# Patient Record
Sex: Male | Born: 2000 | Hispanic: Yes | Marital: Single | Smoking: Former smoker
Health system: Southern US, Community
[De-identification: ages and names within clinical notes are randomized; demographics above are authoritative.]

## PROBLEM LIST (undated history)

## (undated) DIAGNOSIS — I639 Cerebral infarction, unspecified: Secondary | ICD-10-CM

## (undated) DIAGNOSIS — F1910 Other psychoactive substance abuse, uncomplicated: Secondary | ICD-10-CM

## (undated) DIAGNOSIS — T50901A Poisoning by unspecified drugs, medicaments and biological substances, accidental (unintentional), initial encounter: Secondary | ICD-10-CM

---
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
## 2022-03-18 IMAGING — DX DG RIBS W/ CHEST 3+V*L*
3 series · 3 of 3 positions shown · non-contrast
Comparison: None.

CLINICAL DATA: Left lower rib and chest pain.

EXAM:
LEFT RIBS AND CHEST - 3+ VIEW

[chest ap]
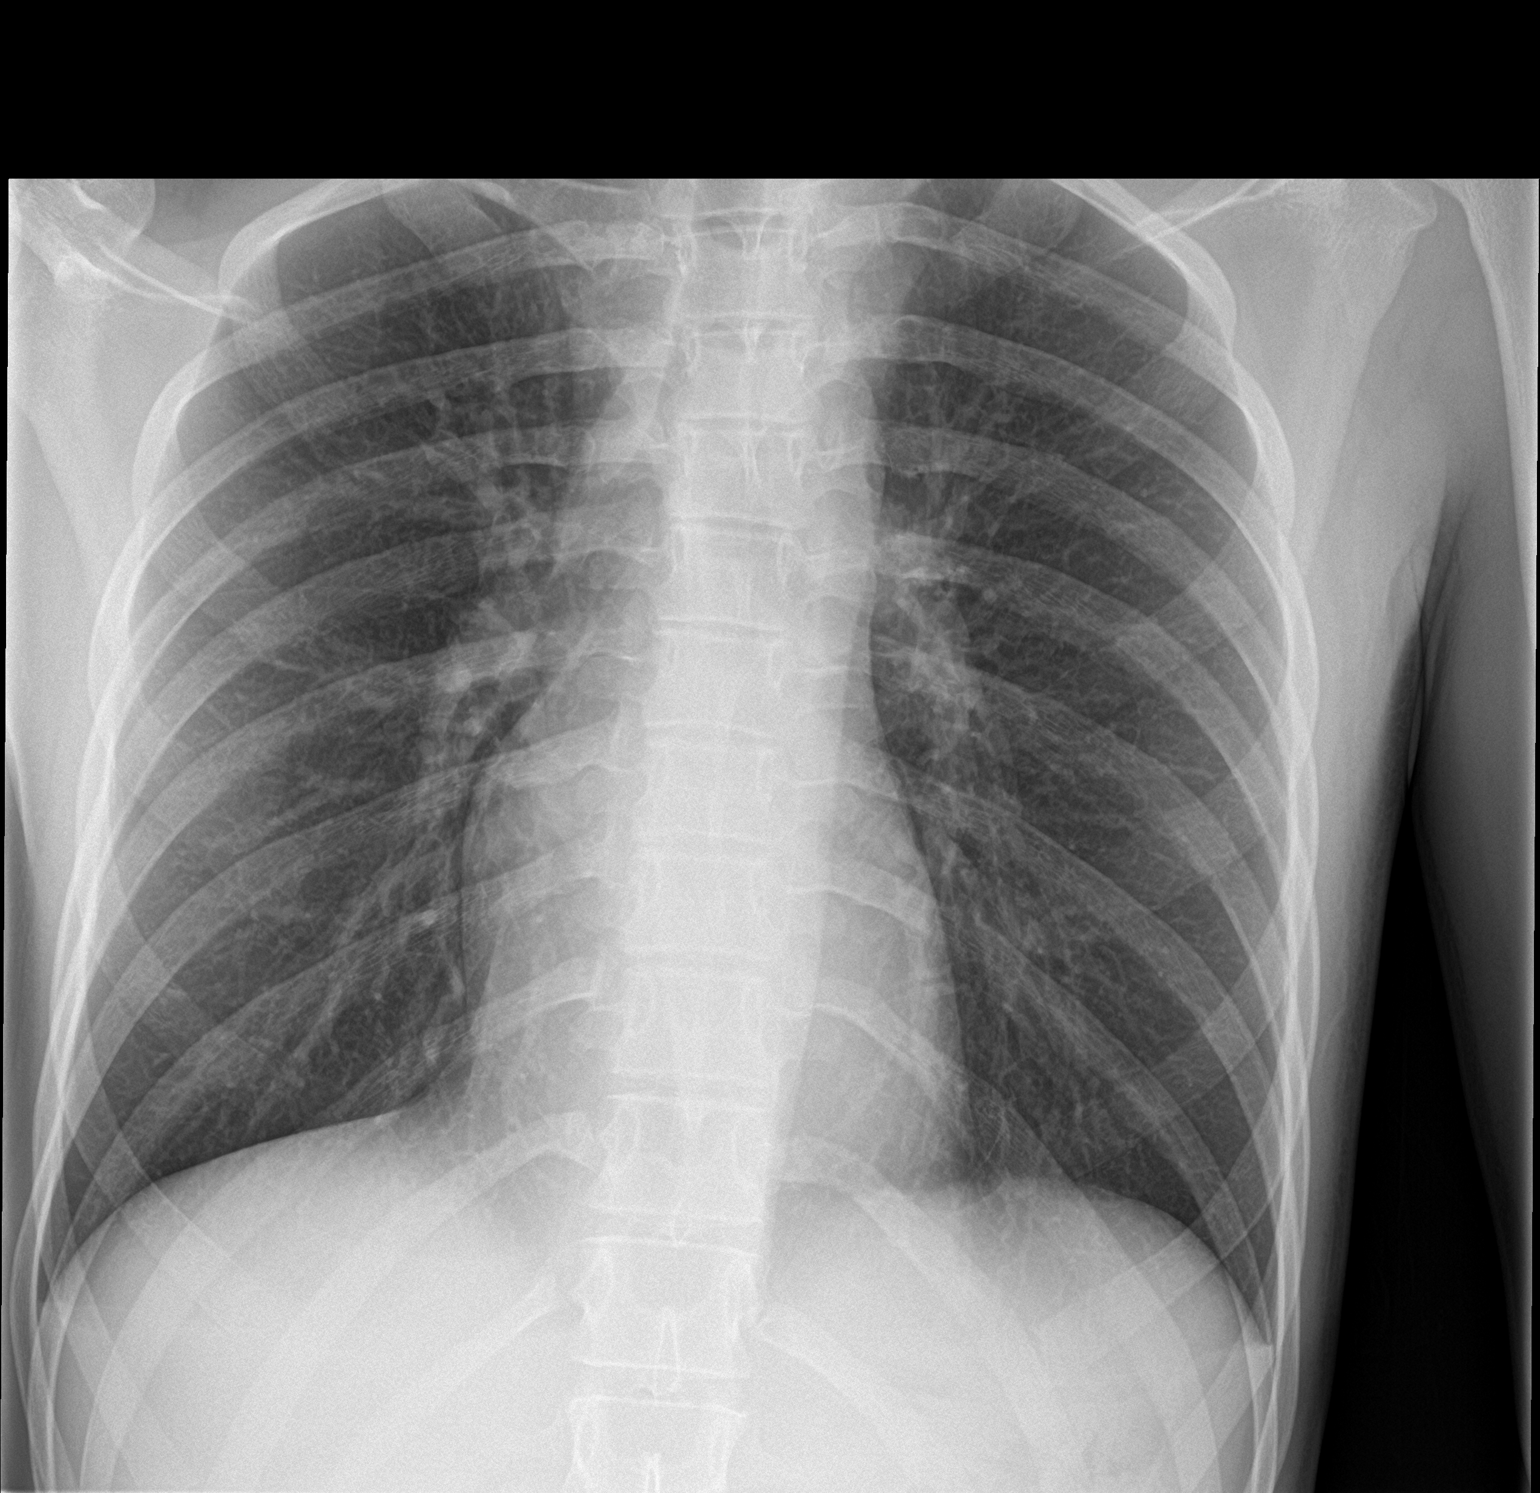

[rib ap]
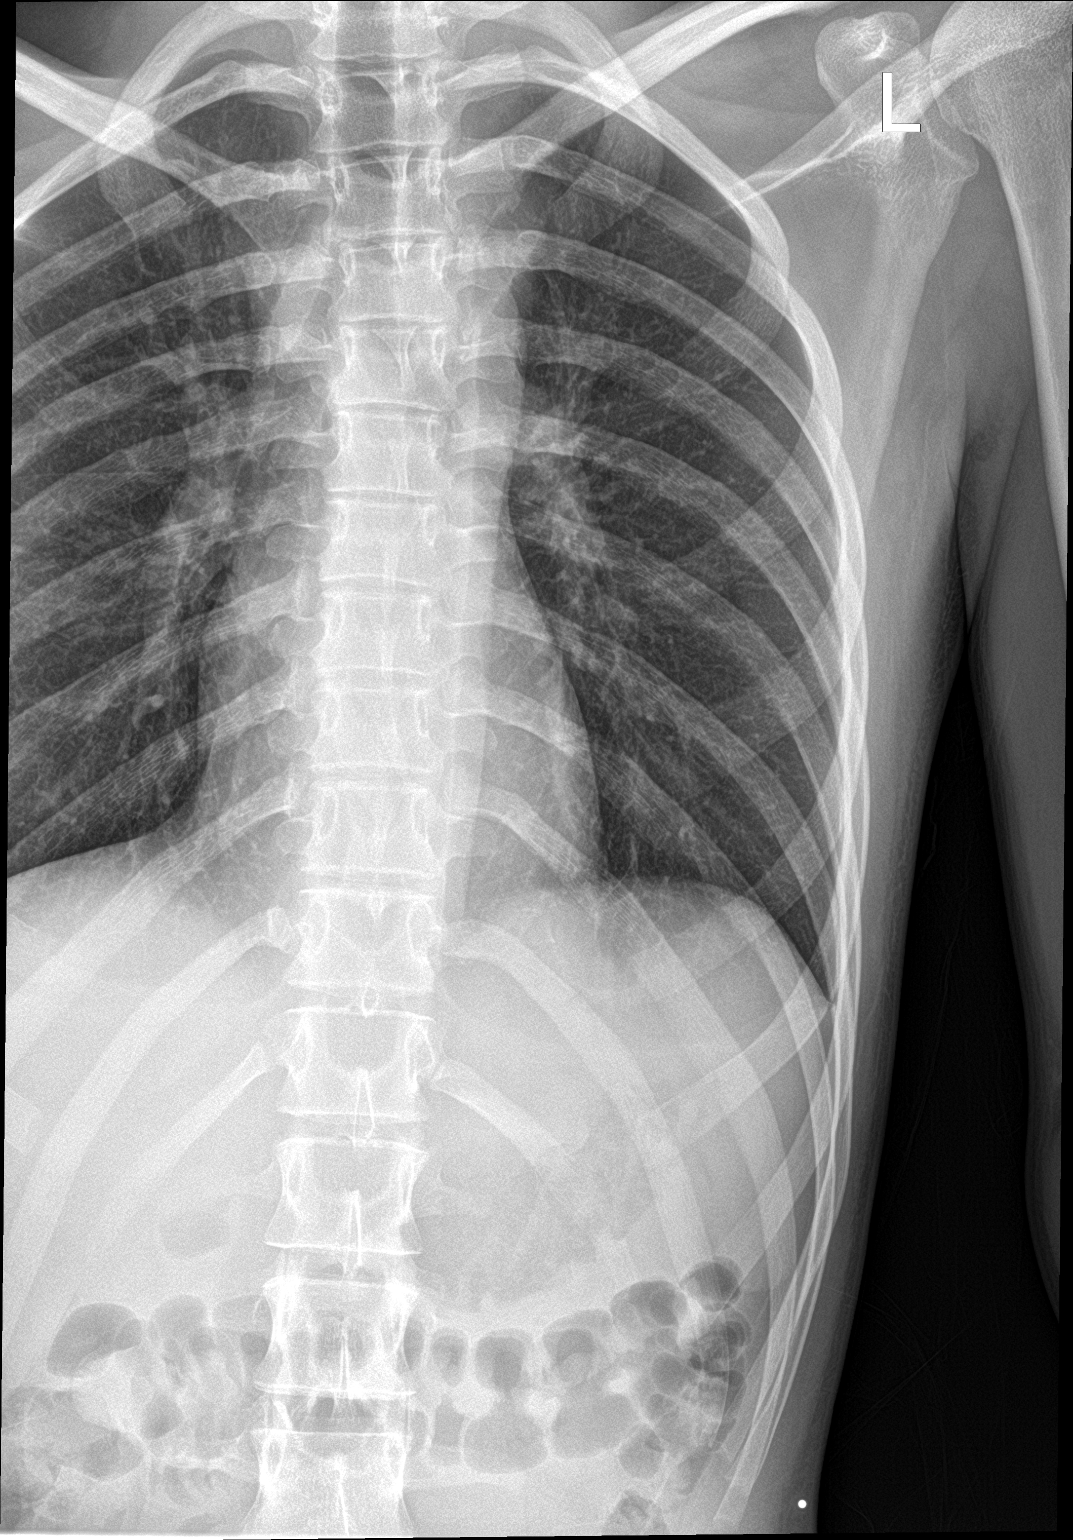

[rib ap obl]
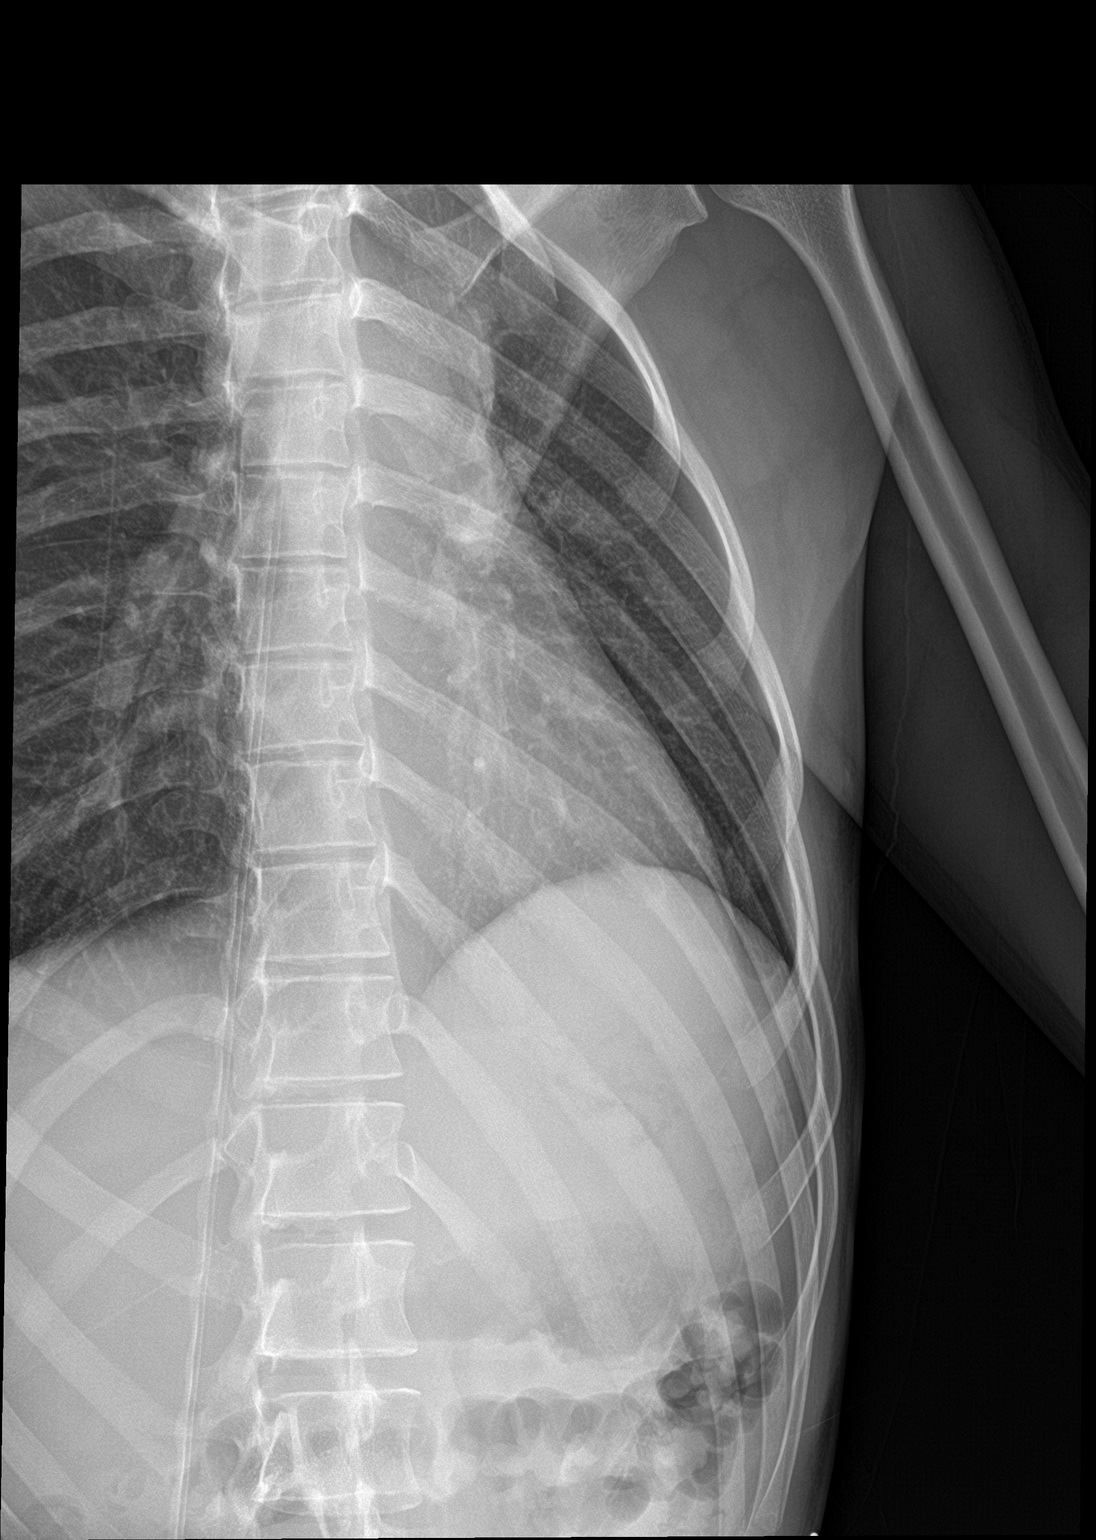

[3 of 3 positions shown; findings below may reference images not displayed]

FINDINGS: No fracture or other bone lesions are seen involving the ribs. There
is no evidence of pneumothorax or pleural effusion. Both lungs are
clear. Heart size and mediastinal contours are within normal limits.
IMPRESSION: Negative.

## 2022-04-07 IMAGING — CT CT HEAD W/O CM
4 series · 15 of 47 positions shown, 17 images · non-contrast
Comparison: January 17, 2020.

CLINICAL DATA: Altered mental status.

EXAM:
CT HEAD WITHOUT CONTRAST
TECHNIQUE: Contiguous axial images were obtained from the base of the skull
through the vertex without intravenous contrast.

[Series 3: head without · axial · non-contrast · 0.45mm/px · z∈[-171,-51]mm · 7 of 33 slices shown, 9 images]
[im 5/33  brain]
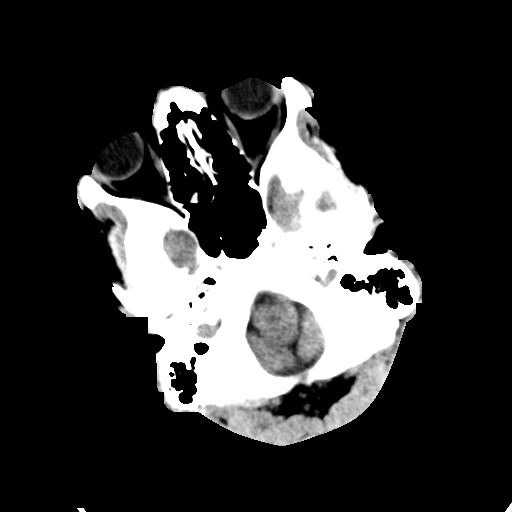
[im 5/33  bone]
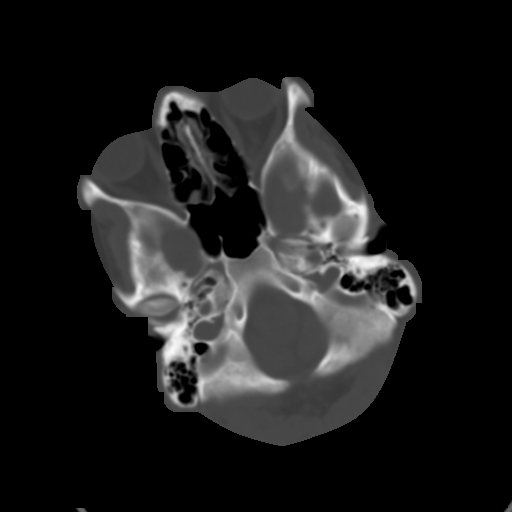
[im 9/33  brain]
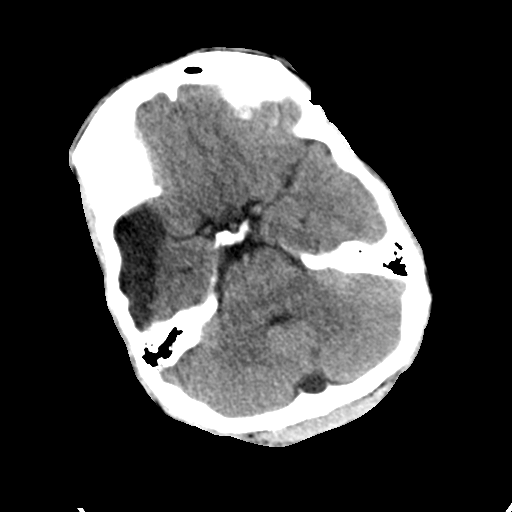
[im 13/33  brain]
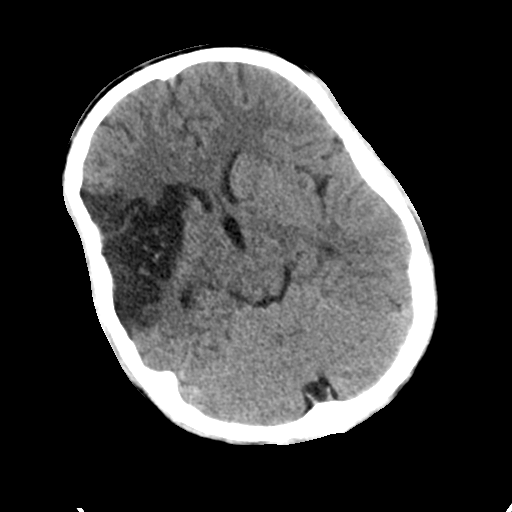
[im 17/33  brain]
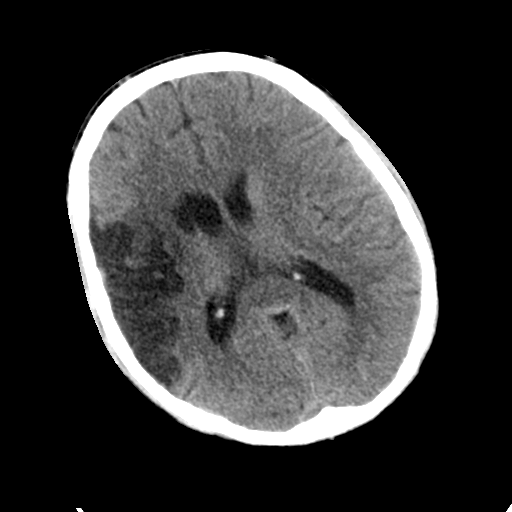
[im 21/33  brain]
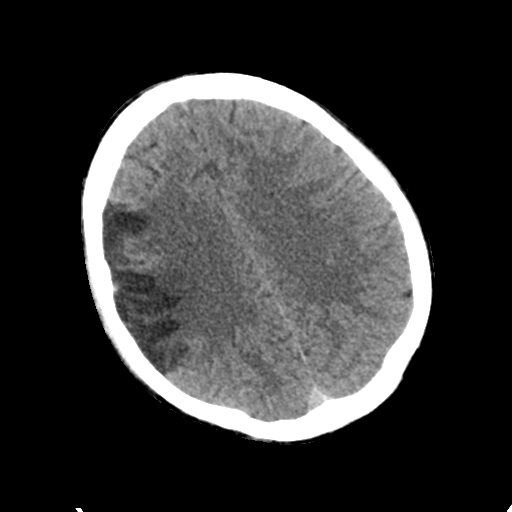
[im 21/33  bone]
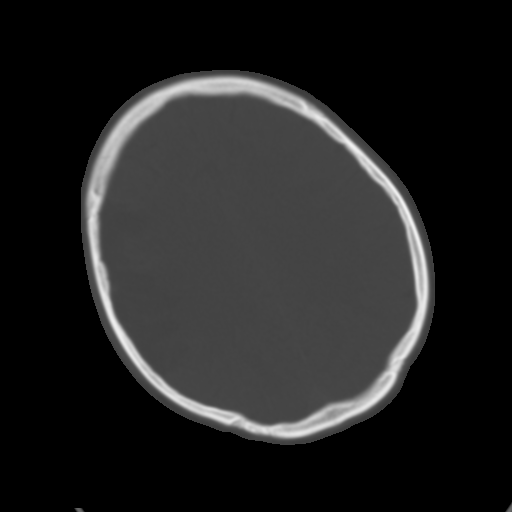
[im 25/33  brain]
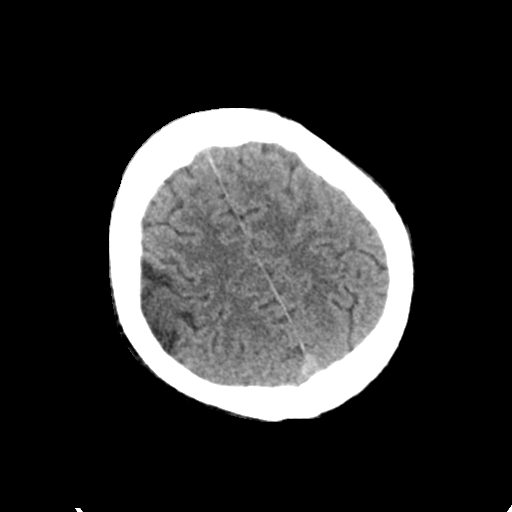
[im 29/33  brain]
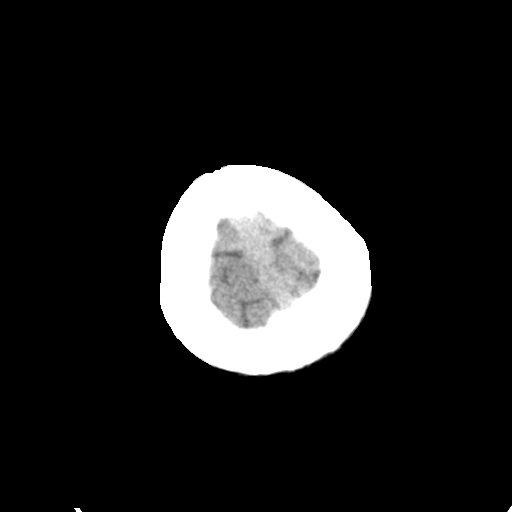

[Series 4: head bone · axial · 0.45mm/px · z∈[-175,-159]mm · 2 of 82 slices shown]
[im 9/82  bone]
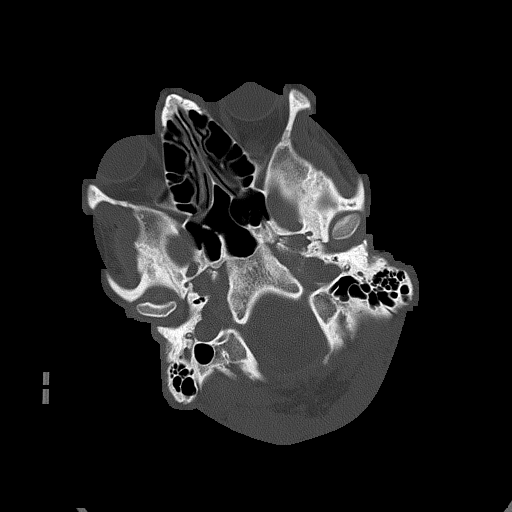
[im 17/82  bone]
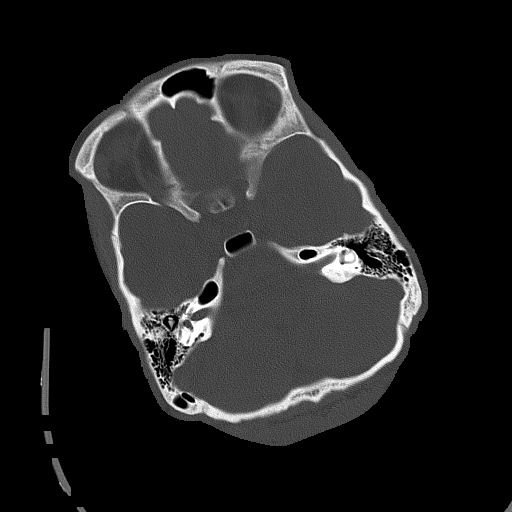

[Series 5: head without cor · coronal · non-contrast · 0.34mm/px · 3 of 67 slices shown]
[im 23/67  brain]
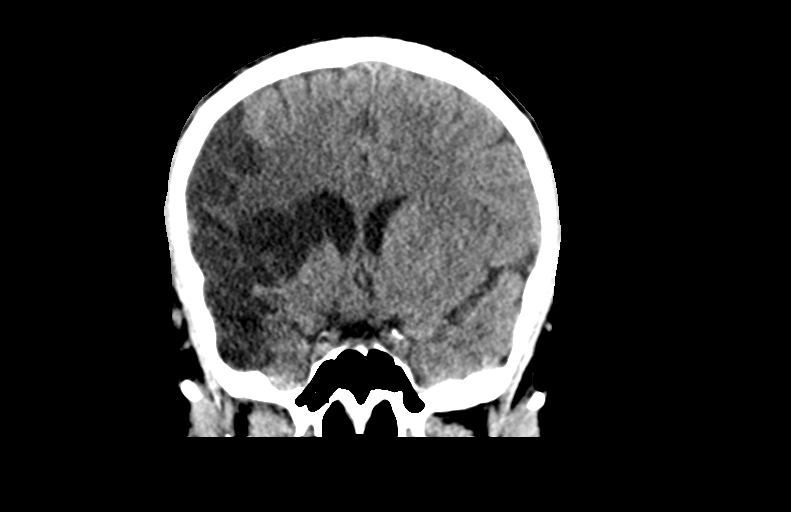
[im 30/67  brain]
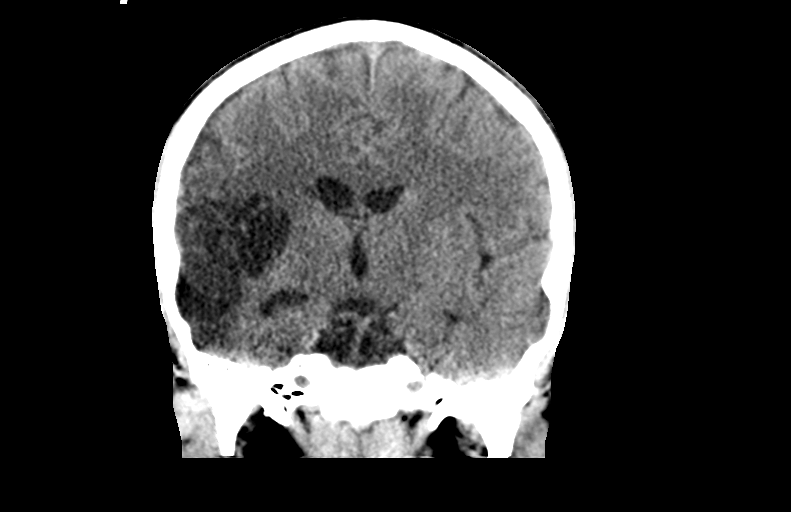
[im 37/67  brain]
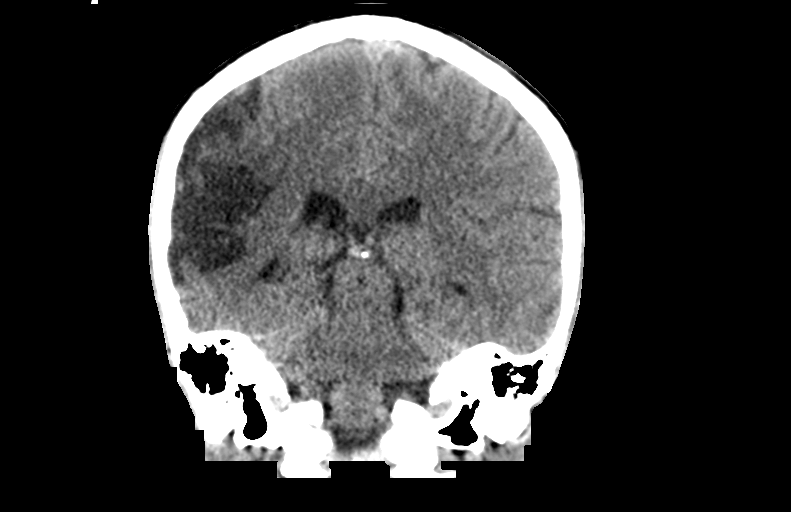

[Series 6: head without sag · sagittal · non-contrast · 0.35mm/px · 3 of 67 slices shown]
[im 23/67  brain]
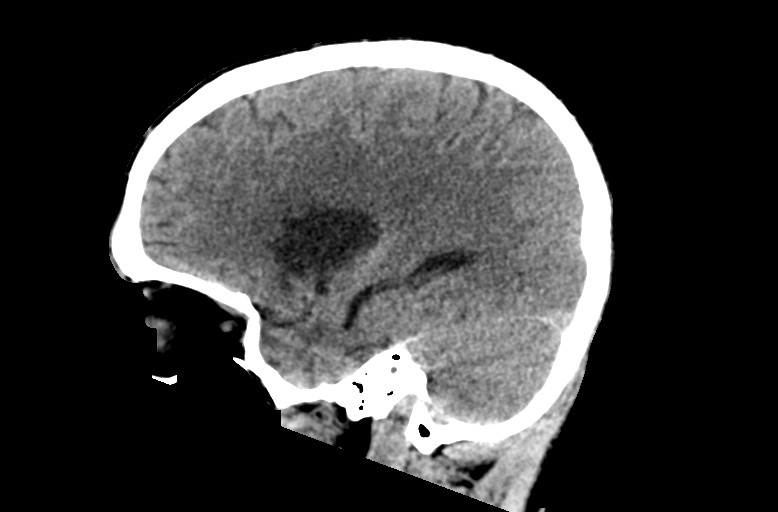
[im 34/67  brain]
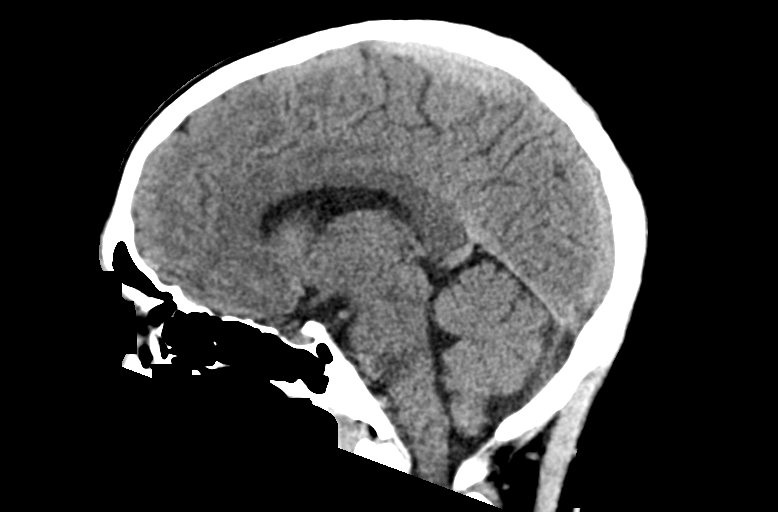
[im 45/67  brain]
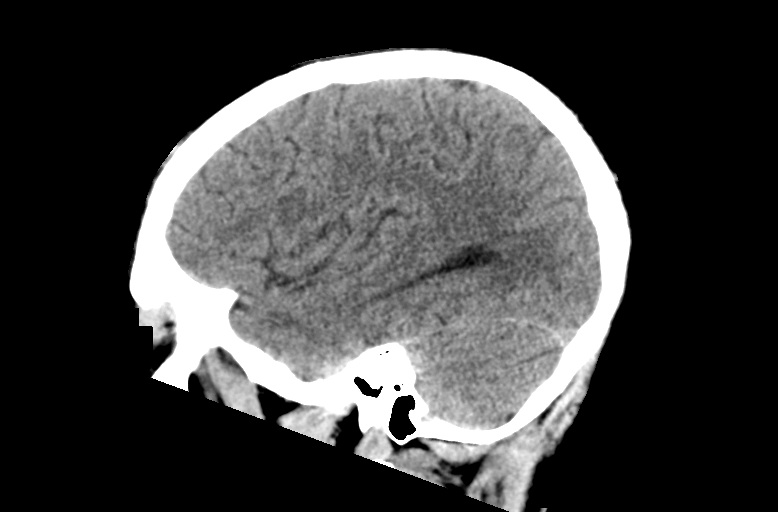

[15 of 47 positions shown; findings below may reference images not displayed]

FINDINGS: Brain: Right frontal parietal encephalomalacia is noted consistent
with old infarction. No mass effect or midline shift is noted.
Ventricular size is within normal limits. There is no evidence of
mass lesion, hemorrhage or acute infarction.

Vascular: No hyperdense vessel or unexpected calcification.

Skull: Normal. Negative for fracture or focal lesion.

Sinuses/Orbits: No acute finding.

Other: None.
IMPRESSION: Right frontal parietal encephalomalacia consistent with old
infarction. No acute intracranial abnormality seen.
